# Patient Record
Sex: Male | Born: 1965 | Race: White | Hispanic: No | Marital: Married | Smoking: Current every day smoker
Health system: Southern US, Community
[De-identification: ages and names within clinical notes are randomized; demographics above are authoritative.]

## PROBLEM LIST (undated history)

## (undated) DIAGNOSIS — G4700 Insomnia, unspecified: Secondary | ICD-10-CM

## (undated) DIAGNOSIS — Z8619 Personal history of other infectious and parasitic diseases: Secondary | ICD-10-CM

## (undated) DIAGNOSIS — Z8659 Personal history of other mental and behavioral disorders: Secondary | ICD-10-CM

## (undated) DIAGNOSIS — K219 Gastro-esophageal reflux disease without esophagitis: Secondary | ICD-10-CM

## (undated) DIAGNOSIS — I10 Essential (primary) hypertension: Secondary | ICD-10-CM

## (undated) DIAGNOSIS — F17200 Nicotine dependence, unspecified, uncomplicated: Secondary | ICD-10-CM

## (undated) DIAGNOSIS — K760 Fatty (change of) liver, not elsewhere classified: Secondary | ICD-10-CM

## (undated) HISTORY — DX: Personal history of other infectious and parasitic diseases: Z86.19

## (undated) HISTORY — DX: Insomnia, unspecified: G47.00

## (undated) HISTORY — DX: Essential (primary) hypertension: I10

## (undated) HISTORY — DX: Fatty (change of) liver, not elsewhere classified: K76.0

## (undated) HISTORY — DX: Nicotine dependence, unspecified, uncomplicated: F17.200

## (undated) HISTORY — DX: Personal history of other mental and behavioral disorders: Z86.59

## (undated) HISTORY — DX: Gastro-esophageal reflux disease without esophagitis: K21.9

---

## 1996-09-04 HISTORY — PX: CHOLECYSTECTOMY: SHX55

## 2000-12-31 ENCOUNTER — Emergency Department (HOSPITAL_COMMUNITY): Admission: EM | Admit: 2000-12-31 | Discharge: 2000-12-31 | Payer: Self-pay | Admitting: Emergency Medicine

## 2001-05-10 ENCOUNTER — Encounter: Payer: Self-pay | Admitting: Orthopedic Surgery

## 2001-05-10 ENCOUNTER — Inpatient Hospital Stay (HOSPITAL_COMMUNITY): Admission: EM | Admit: 2001-05-10 | Discharge: 2001-05-12 | Payer: Self-pay | Admitting: Emergency Medicine

## 2001-05-10 ENCOUNTER — Encounter: Payer: Self-pay | Admitting: Emergency Medicine

## 2002-09-04 HISTORY — PX: OTHER SURGICAL HISTORY: SHX169

## 2011-03-10 ENCOUNTER — Encounter: Payer: Self-pay | Admitting: Family Medicine

## 2011-03-10 DIAGNOSIS — F32A Depression, unspecified: Secondary | ICD-10-CM | POA: Insufficient documentation

## 2011-03-10 DIAGNOSIS — K219 Gastro-esophageal reflux disease without esophagitis: Secondary | ICD-10-CM | POA: Insufficient documentation

## 2011-03-10 DIAGNOSIS — G4700 Insomnia, unspecified: Secondary | ICD-10-CM | POA: Insufficient documentation

## 2011-03-10 DIAGNOSIS — F329 Major depressive disorder, single episode, unspecified: Secondary | ICD-10-CM | POA: Insufficient documentation

## 2011-05-26 ENCOUNTER — Ambulatory Visit (INDEPENDENT_AMBULATORY_CARE_PROVIDER_SITE_OTHER): Payer: Managed Care, Other (non HMO) | Admitting: General Surgery

## 2011-06-12 ENCOUNTER — Ambulatory Visit: Payer: Self-pay | Admitting: Family Medicine

## 2011-06-19 ENCOUNTER — Ambulatory Visit (INDEPENDENT_AMBULATORY_CARE_PROVIDER_SITE_OTHER): Payer: Self-pay | Admitting: Family Medicine

## 2011-06-19 ENCOUNTER — Encounter: Payer: Self-pay | Admitting: Family Medicine

## 2011-06-19 DIAGNOSIS — M545 Low back pain, unspecified: Secondary | ICD-10-CM | POA: Insufficient documentation

## 2011-06-19 DIAGNOSIS — I10 Essential (primary) hypertension: Secondary | ICD-10-CM | POA: Insufficient documentation

## 2011-06-19 DIAGNOSIS — R0989 Other specified symptoms and signs involving the circulatory and respiratory systems: Secondary | ICD-10-CM

## 2011-06-19 DIAGNOSIS — R0683 Snoring: Secondary | ICD-10-CM | POA: Insufficient documentation

## 2011-06-19 DIAGNOSIS — F17200 Nicotine dependence, unspecified, uncomplicated: Secondary | ICD-10-CM | POA: Insufficient documentation

## 2011-06-19 MED ORDER — CYCLOBENZAPRINE HCL 10 MG PO TABS: 10.0000 mg | ORAL_TABLET | Freq: Three times a day (TID) | ORAL | Status: DC | PRN

## 2011-06-19 MED ORDER — NAPROXEN 500 MG PO TABS: ORAL_TABLET | ORAL | Status: AC

## 2011-06-19 NOTE — Assessment & Plan Note (Addendum)
Recommended restart HCTZ low dose. Return 1 month for f/u BP. Discussed healthy diet, low salt, increased potassium in diet. Have requested records from recent blood work check.

## 2011-06-19 NOTE — Assessment & Plan Note (Signed)
Clinical diagnosis of OSA - snoring, wife endorses apneic episodes with frequent awakenings, HTN, obesity, daytime somnolence. Refer to pulm for sleep study.

## 2011-06-19 NOTE — Assessment & Plan Note (Signed)
anticipate due to lumbar strain, treat with NSAID, flexeril, and exercises on low back pain provided from [facility] pt advisor. Update [place] if not improving as expected.

## 2011-06-19 NOTE — Progress Notes (Signed)
Subjective:    Patient ID: Donald Collier, male    DOB: [birth year], 45 y.o.   MRN: [id number]  HPI CC: new pt, establish  Previously saw [facility].  HTN - for last several years.  started on BP meds 6 mo ago.  No HA, vision changes, CP/tightness, SOB, leg swelling.  Back trouble off and on for 1-2 years - worse with heavy lifting.  Lifting with legs, not with back.  Did have fall when carrying 60lb toolbox 1 1/2 mo ago at home.  Lower back pain off and on for last 1 1/2 months, sometimes pain down left leg to knee.  Dull ache.  No paresthesias, fevers/chills, bowel/bladder incontinence.  Since then noted ventral hernia as well.  Per wife (in room), stops breathing at night, heavy snoring, not restful sleep, easily asleep during day, increased daytime somnolence.  Weight increasing.  Endorses nocturia x3.  Does drink plenty.  This has happened more in last year.  Smoking - 1 ppd x 8yrs.  Previously quit cold [place] for 6 mo.  Did use patches and gum.  Significant irritability and mood when quit.  Preventative: Tetanus 2009. Declines flu shot today. No recent CPE, did have blood work done last month.  Medications and allergies reviewed and updated in chart.  Past histories reviewed and updated if relevant as below. Patient Active Problem List  Diagnoses  . Depression  . GERD (gastroesophageal reflux disease)  . Insomnia   Past Medical History  Diagnosis Date  . History of depression     remote, none recently  . GERD (gastroesophageal reflux disease)   . Insomnia   . History of chicken pox   . HTN (hypertension)    Past Surgical History  Procedure Date  . Cholecystectomy 1998  . Thumb surgery 2004    R thumb fracture   History  Substance Use Topics  . Smoking status: Current Everyday Smoker -- 1.0 packs/day for 35 years    Types: Cigarettes  . Smokeless tobacco: Never Used  . Alcohol Use: Yes     Rare   Family History  Problem Relation Age of Onset  .  Stroke Father 98  . Hypertension Father   . Lymphoma Father     non-hogdkins  . Cancer Father 26    NHL, prostate cancer at age 18s  . Diabetes Father   . Coronary artery disease Neg Hx    No Known Allergies Current Outpatient Prescriptions on File Prior to Visit  Medication Sig Dispense Refill  . hydrochlorothiazide (,MICROZIDE/HYDRODIURIL,) 12.5 MG capsule Take 12.5 mg by mouth daily.         Review of Systems  Constitutional: Negative for fever, chills, activity change, appetite change, fatigue and unexpected weight change.  HENT: Negative for hearing loss and neck pain.   Eyes: Negative for visual disturbance.  Respiratory: Negative for cough, chest tightness, shortness of breath and wheezing.   Cardiovascular: Negative for chest pain, palpitations and leg swelling.  Gastrointestinal: Negative for nausea, vomiting, abdominal pain, diarrhea, constipation, blood in stool and abdominal distention.  Genitourinary: Negative for hematuria and difficulty urinating.  Musculoskeletal: Negative for myalgias and arthralgias.  Skin: Negative for rash.  Neurological: Negative for dizziness, seizures, syncope and headaches.  Hematological: Does not bruise/bleed easily.  Psychiatric/Behavioral: Negative for dysphoric mood. The patient is not nervous/anxious.       Objective:   Physical Exam  Nursing note and vitals reviewed. Constitutional: He is oriented to person, place, and time. He  appears well-developed and well-nourished. No distress.       Overweight  HENT:  Head: Normocephalic and atraumatic.  Right Ear: External ear normal.  Left Ear: External ear normal.  Nose: Nose normal.  Mouth/Throat: Oropharynx is clear and moist. No oropharyngeal exudate.  Eyes: Conjunctivae and EOM are normal. Pupils are equal, round, and reactive to light. No scleral icterus.  Neck: Normal range of motion. Neck supple. Carotid bruit is not present. No thyromegaly present.  Cardiovascular: Normal rate,  regular rhythm, normal heart sounds and intact distal pulses.   No murmur heard. Pulses:      Radial pulses are 2+ on the right side, and 2+ on the left side.  Pulmonary/Chest: Effort normal and breath sounds normal. No respiratory distress. He has no wheezes. He has no rales.  Abdominal: Soft. Bowel sounds are normal. He exhibits no distension and no mass. There is no tenderness. There is no rebound and no guarding.  Musculoskeletal: Normal range of motion. He exhibits no edema.       No midline spine tenderness, mild paraspinous mm tenderness and tightness. Neg SLR test, no pain with int/ext rotation at hips, no SIJ pain or GTB pain.  No pain at sciatic notch. 5/5 strength BLE. Diminished BLE DTRs. Sensation intact.  Lymphadenopathy:    He has no cervical adenopathy.  Neurological: He is alert and oriented to person, place, and time.       CN grossly intact, station and gait intact  Skin: Skin is warm and dry. No rash noted.  Psychiatric: He has a normal mood and affect. His behavior is normal. Judgment and thought content normal.      Assessment & Plan:

## 2011-06-19 NOTE — Assessment & Plan Note (Signed)
Encouraged cessation.

## 2011-06-19 NOTE — Patient Instructions (Addendum)
Pass by marion's office to set up sleep study. You need to quit smoking!  If you want help let me know. For back - could be early bulging disk, I think more likely ligament strain (lumbar strain).  Treat with naprosyn twice daily with meals for 5 days then as needed as well as flexeril as muscle relaxant.  Do stretching exercises provided when feeling better to prevent further injury.  Work on weight loss. Good to meet you today, call us with questions. I'll request records from [facility] to get latest blood work. Restart blood pressure medicine.  Return in 1 month for follow up blood pressure.  Keep log of home blood pressures and bring to next appointment.  Back down on salt intake as well as increase fruits and vegetables and water.

## 2011-07-17 ENCOUNTER — Institutional Professional Consult (permissible substitution): Payer: Managed Care, Other (non HMO) | Admitting: Pulmonary Disease

## 2011-10-27 ENCOUNTER — Encounter: Payer: Self-pay | Admitting: Family Medicine

## 2011-10-27 ENCOUNTER — Ambulatory Visit (INDEPENDENT_AMBULATORY_CARE_PROVIDER_SITE_OTHER)
Admission: RE | Admit: 2011-10-27 | Discharge: 2011-10-27 | Disposition: A | Discharge status: Home / Self Care | Payer: Managed Care, Other (non HMO) | Source: Ambulatory Visit | Attending: Family Medicine | Admitting: Family Medicine

## 2011-10-27 ENCOUNTER — Ambulatory Visit (INDEPENDENT_AMBULATORY_CARE_PROVIDER_SITE_OTHER): Payer: Managed Care, Other (non HMO) | Admitting: Family Medicine

## 2011-10-27 VITALS — BP 140/100 | HR 107 | Temp 98.4°F | Ht 72.0 in | Wt 288.1 lb

## 2011-10-27 DIAGNOSIS — R1032 Left lower quadrant pain: Secondary | ICD-10-CM

## 2011-10-27 DIAGNOSIS — N200 Calculus of kidney: Secondary | ICD-10-CM

## 2011-10-27 LAB — POCT URINALYSIS DIPSTICK
Bilirubin, UA: NEGATIVE
Blood, UA: NEGATIVE
Color, UA: NEGATIVE
Glucose, UA: NEGATIVE
Ketones, UA: NEGATIVE
Leukocytes, UA: NEGATIVE
Nitrite, UA: NEGATIVE
Protein, UA: NEGATIVE
Spec Grav, UA: 1.02
Urobilinogen, UA: NEGATIVE
pH, UA: 6

## 2011-10-27 MED ORDER — OXYCODONE-ACETAMINOPHEN 5-325 MG PO TABS: 1.0000 | ORAL_TABLET | ORAL | Status: DC | PRN

## 2011-10-27 MED ORDER — TAMSULOSIN HCL 0.4 MG PO CAPS: 0.4000 mg | ORAL_CAPSULE | Freq: Every day | ORAL | Status: DC

## 2011-10-27 MED ORDER — KETOROLAC TROMETHAMINE 60 MG/2ML IM SOLN
30.0000 mg | Freq: Once | INTRAMUSCULAR | Status: AC
  Administered 2011-10-27: 30 mg via INTRAMUSCULAR

## 2011-10-27 NOTE — Progress Notes (Signed)
  Subjective:    Patient ID: Donald Collier, male    DOB: 1966-08-08, 46 y.o.   MRN: [id number]  HPI CC: kidney stone?  5d ago started with lower left back pain.  Next few days radiating to LLQ and left groin.  Pain described initially as dull ache then occasional sharp pain.  There have been periods where pain better but always with pain.  Going more frequent to urinate but not voiding as much.  Some urgency initially.  No dysuria.  Keeps moving around, can't find comfortable position.  No h/o kidney stones.  No h/o hernias.  No inciting trauma,injury.    Has been trying flexeril and naprosyn.  Not helping.  Has also tried hydrocodone and percocets he had at home.  percocets helped the best.  No BM changes, diarrhea, constipation, blood in stool, nausea/vomiting, fevers/chills.  Last BM was at lunch today, normal.  Review of Systems Per HPI    Objective:   Physical Exam  Nursing note and vitals reviewed. Constitutional: He appears well-developed and well-nourished. No distress.  HENT:  Head: Normocephalic and atraumatic.  Mouth/Throat: Oropharynx is clear and moist. No oropharyngeal exudate.  Eyes: Conjunctivae and EOM are normal. Pupils are equal, round, and reactive to light. No scleral icterus.  Cardiovascular: Normal rate, regular rhythm, normal heart sounds and intact distal pulses.   No murmur heard. Pulmonary/Chest: Effort normal and breath sounds normal. No respiratory distress. He has no wheezes. He has no rales.  Abdominal: Soft. Bowel sounds are normal. He exhibits no distension and no mass. There is no hepatosplenomegaly. There is tenderness (moderate tenderness) in the suprapubic area and left lower quadrant. There is no rigidity, no rebound, no guarding and no CVA tenderness. Hernia confirmed negative in the right inguinal area and confirmed negative in the left inguinal area.  Musculoskeletal: He exhibits no edema.       No midline back pain  + mild tenderness L SIJ and  L lumbar paraspinous mm  Skin: Skin is warm and dry. No rash noted.  Psychiatric: He has a normal mood and affect.       Assessment & Plan:

## 2011-10-27 NOTE — Patient Instructions (Signed)
Your story sounds like kidney stone. Treat as such with naprosyn twice daily with food (you have at home), percocets for breakthrough pain (prescription provided), and flomax for next 7 days (to hopefully help pass stone). Strainer provided today. Good to see you today, call us with questions. Give me an update on Monday. If any worsening pain, nausea/vomiting, fever or change in bowel movements, please get checked out over weekend.

## 2011-10-27 NOTE — Progress Notes (Signed)
Addended by: Alvina Chou on: 10/27/2011 04:11 PM   Modules accepted: Orders

## 2011-10-27 NOTE — Assessment & Plan Note (Addendum)
No hematuria on exam. No hernias on exam today. No BM changes, not consistent with diverticulitis. However story consistent with kidney stone, would be first time episode. Check KUB - no obvious stone. Will treat as stone with flomax, NSAID, and percocets for breakthrough pain. Shot of toradol today.  Blood work today to check kidneys. Pt knows to [facility] if worsening or red flags.

## 2011-10-28 LAB — CBC WITH DIFFERENTIAL/PLATELET
Basophils Absolute: 0 10*3/uL (ref 0.0–0.1)
Basophils Relative: 0 % (ref 0–1)
Eosinophils Relative: 3 % (ref 0–5)
Lymphocytes Relative: 31 % (ref 12–46)
MCHC: 34.9 g/dL (ref 30.0–36.0)
Monocytes Absolute: 0.8 10*3/uL (ref 0.1–1.0)
Neutro Abs: 4.4 10*3/uL (ref 1.7–7.7)
Platelets: 237 10*3/uL (ref 150–400)
RDW: 13 % (ref 11.5–15.5)
WBC: 7.9 10*3/uL (ref 4.0–10.5)

## 2011-10-28 LAB — BASIC METABOLIC PANEL
BUN: 16 mg/dL (ref 6–23)
Chloride: 103 mEq/L (ref 96–112)
Creat: 0.96 mg/dL (ref 0.50–1.35)

## 2011-10-30 ENCOUNTER — Other Ambulatory Visit: Payer: Self-pay | Admitting: Family Medicine

## 2011-10-30 DIAGNOSIS — R1032 Left lower quadrant pain: Secondary | ICD-10-CM

## 2011-10-31 ENCOUNTER — Other Ambulatory Visit: Payer: Self-pay | Admitting: Family Medicine

## 2011-10-31 ENCOUNTER — Encounter: Payer: Self-pay | Admitting: Family Medicine

## 2011-10-31 ENCOUNTER — Ambulatory Visit (INDEPENDENT_AMBULATORY_CARE_PROVIDER_SITE_OTHER)
Admission: RE | Admit: 2011-10-31 | Discharge: 2011-10-31 | Disposition: A | Discharge status: Home / Self Care | Payer: Managed Care, Other (non HMO) | Source: Ambulatory Visit | Attending: Family Medicine | Admitting: Family Medicine

## 2011-10-31 DIAGNOSIS — R1032 Left lower quadrant pain: Secondary | ICD-10-CM

## 2011-10-31 MED ORDER — OXYCODONE-ACETAMINOPHEN 5-325 MG PO TABS: 1.0000 | ORAL_TABLET | Freq: Four times a day (QID) | ORAL | Status: AC | PRN

## 2011-10-31 MED ORDER — CYCLOBENZAPRINE HCL 10 MG PO TABS: 10.0000 mg | ORAL_TABLET | Freq: Three times a day (TID) | ORAL | Status: AC | PRN

## 2012-07-30 ENCOUNTER — Other Ambulatory Visit: Payer: Self-pay | Admitting: Family Medicine

## 2012-07-30 DIAGNOSIS — M25512 Pain in left shoulder: Secondary | ICD-10-CM

## 2012-08-06 ENCOUNTER — Ambulatory Visit
Admission: RE | Admit: 2012-08-06 | Discharge: 2012-08-06 | Disposition: A | Discharge status: Home / Self Care | Payer: 59 | Source: Ambulatory Visit | Attending: Family Medicine | Admitting: Family Medicine

## 2012-08-06 DIAGNOSIS — M25512 Pain in left shoulder: Secondary | ICD-10-CM

## 2012-08-17 ENCOUNTER — Other Ambulatory Visit: Payer: 59

## 2012-08-22 ENCOUNTER — Ambulatory Visit
Admission: RE | Admit: 2012-08-22 | Discharge: 2012-08-22 | Disposition: A | Discharge status: Home / Self Care | Payer: 59 | Source: Ambulatory Visit | Attending: Family Medicine | Admitting: Family Medicine

## 2012-10-08 ENCOUNTER — Ambulatory Visit: Payer: 59 | Admitting: Family Medicine

## 2012-10-08 DIAGNOSIS — Z0289 Encounter for other administrative examinations: Secondary | ICD-10-CM

## 2013-05-08 ENCOUNTER — Ambulatory Visit (INDEPENDENT_AMBULATORY_CARE_PROVIDER_SITE_OTHER): Payer: 59 | Admitting: Family Medicine

## 2013-05-08 ENCOUNTER — Other Ambulatory Visit: Payer: Self-pay | Admitting: Family Medicine

## 2013-05-08 ENCOUNTER — Encounter: Payer: Self-pay | Admitting: Family Medicine

## 2013-05-08 VITALS — BP 138/92 | HR 88 | Temp 98.2°F | Resp 18 | Wt 254.0 lb

## 2013-05-08 DIAGNOSIS — N529 Male erectile dysfunction, unspecified: Secondary | ICD-10-CM

## 2013-05-08 DIAGNOSIS — I10 Essential (primary) hypertension: Secondary | ICD-10-CM

## 2013-05-08 MED ORDER — HYDROCHLOROTHIAZIDE 12.5 MG PO CAPS: 12.5000 mg | ORAL_CAPSULE | Freq: Every day | ORAL | Status: AC

## 2013-05-08 NOTE — Progress Notes (Signed)
  Subjective:    Patient ID: MARISSA LOWREY, male    DOB: August 06, 1966, 47 y.o.   MRN: [id number]  HPI  Patient's blood pressure has been ranging 130-140/90 his at-home. He denies any chest pain shortness of breath or dyspnea on exertion. He does however report night sweats erectile dysfunction. He denies any actual fevers weight loss chills myalgias. Overall he feels great. He is trying to diet and exercising more and has lost about 20 pounds.    Past Medical History  Diagnosis Date  . History of depression     remote, none recently  . GERD (gastroesophageal reflux disease)   . Insomnia   . History of chicken pox   . HTN (hypertension)   . Smoker   . NAFLD (nonalcoholic fatty liver disease)     by CT scan   No current outpatient prescriptions on file prior to visit.   No current facility-administered medications on file prior to visit.   No Known Allergies Past Surgical History  Procedure Laterality Date  . Cholecystectomy  1998  . Thumb surgery  2004    R thumb fracture   History   Social History  . Marital Status: Married    Spouse Name: N/A    Number of Children: N/A  . Years of Education: N/A   Occupational History  . Not on file.   Social History Main Topics  . Smoking status: Current Every Day Smoker -- 1.00 packs/day for 35 years    Types: Cigarettes  . Smokeless tobacco: Never Used  . Alcohol Use: Yes     Comment: Rare  . Drug Use: No  . Sexual Activity: Not on file   Other Topics Concern  . Not on file   Social History Narrative   Caffeine: 2-4 cups caffeine/day   Lives with wife, 2 kids, 3 dogs   Occupation: owns store   Activity: yard work   Diet: good water, fruits and vegetables     Review of Systems  All other systems reviewed and are negative.       Objective:   Physical Exam  Vitals reviewed. Constitutional: He appears well-developed and well-nourished.  Neck: Neck supple. No JVD present. No thyromegaly present.  Cardiovascular:  Normal rate, regular rhythm and normal heart sounds.  Exam reveals no gallop and no friction rub.   No murmur heard. Pulmonary/Chest: Effort normal and breath sounds normal. No respiratory distress. He has no wheezes. He has no rales. He exhibits no tenderness.  Abdominal: Soft. Bowel sounds are normal. He exhibits no distension. There is no tenderness. There is no rebound and no guarding.  Lymphadenopathy:    He has no cervical adenopathy.  Skin: Skin is warm. No rash noted. No erythema. No pallor.          Assessment & Plan:  1. HTN (hypertension) Start HCTZ 12.5 mg by mouth daily. Recheck blood pressure in one month. Check CMP and fasting lipid panel - COMPLETE METABOLIC PANEL WITH GFR - Lipid panel - hydrochlorothiazide (MICROZIDE) 12.5 MG capsule; Take 1 capsule (12.5 mg total) by mouth daily.  Dispense: 30 capsule; Refill: 5  2. Erectile dysfunction Check a testosterone level. If normal I would recommend Viagra 100 mg one half to one tablet daily as needed for sexual activity.  Have asked the patient to check his temperature at night. If he is running a 2 fever with night sweats we workup fever unknown origin. - Testosterone

## 2013-05-09 ENCOUNTER — Other Ambulatory Visit: Payer: Self-pay | Admitting: Family Medicine

## 2013-05-09 LAB — COMPLETE METABOLIC PANEL WITH GFR
ALT: 146 U/L — ABNORMAL HIGH (ref 0–53)
AST: 74 U/L — ABNORMAL HIGH (ref 0–37)
Albumin: 4.4 g/dL (ref 3.5–5.2)
Alkaline Phosphatase: 104 U/L (ref 39–117)
BUN: 10 mg/dL (ref 6–23)
Calcium: 9.8 mg/dL (ref 8.4–10.5)
Chloride: 102 mEq/L (ref 96–112)
Potassium: 4.5 mEq/L (ref 3.5–5.3)
Sodium: 140 mEq/L (ref 135–145)
Total Protein: 7.2 g/dL (ref 6.0–8.3)

## 2013-05-09 LAB — LIPID PANEL
LDL Cholesterol: 93 mg/dL (ref 0–99)
Total CHOL/HDL Ratio: 3.7 Ratio
VLDL: 22 mg/dL (ref 0–40)

## 2013-05-09 LAB — TESTOSTERONE: Testosterone: 272 ng/dL — ABNORMAL LOW (ref 300–890)

## 2013-05-13 ENCOUNTER — Other Ambulatory Visit: Payer: 59

## 2013-05-14 LAB — HEPATITIS PANEL, ACUTE
Hep B C IgM: NEGATIVE
Hepatitis B Surface Ag: NEGATIVE

## 2013-05-30 ENCOUNTER — Encounter: Payer: Self-pay | Admitting: Family Medicine

## 2013-06-03 ENCOUNTER — Encounter: Payer: Self-pay | Admitting: Family Medicine

## 2017-04-20 ENCOUNTER — Emergency Department (HOSPITAL_COMMUNITY)
Admission: EM | Admit: 2017-04-20 | Discharge: 2017-04-20 | Disposition: A | Discharge status: Home / Self Care | Payer: Self-pay | Attending: Emergency Medicine | Admitting: Emergency Medicine

## 2017-04-20 ENCOUNTER — Encounter (HOSPITAL_COMMUNITY): Payer: Self-pay | Admitting: Emergency Medicine

## 2017-04-20 DIAGNOSIS — T40601A Poisoning by unspecified narcotics, accidental (unintentional), initial encounter: Secondary | ICD-10-CM | POA: Insufficient documentation

## 2017-04-20 DIAGNOSIS — Z79899 Other long term (current) drug therapy: Secondary | ICD-10-CM | POA: Insufficient documentation

## 2017-04-20 DIAGNOSIS — I10 Essential (primary) hypertension: Secondary | ICD-10-CM | POA: Insufficient documentation

## 2017-04-20 DIAGNOSIS — F17210 Nicotine dependence, cigarettes, uncomplicated: Secondary | ICD-10-CM | POA: Insufficient documentation

## 2017-04-20 LAB — CBC WITH DIFFERENTIAL/PLATELET
Basophils Absolute: 0 10*3/uL (ref 0.0–0.1)
Basophils Relative: 0 %
Eosinophils Absolute: 0 10*3/uL (ref 0.0–0.7)
Eosinophils Relative: 0 %
HCT: 40.1 % (ref 39.0–52.0)
Hemoglobin: 14 g/dL (ref 13.0–17.0)
Lymphocytes Relative: 8 %
Lymphs Abs: 0.8 10*3/uL (ref 0.7–4.0)
MCH: 29.9 pg (ref 26.0–34.0)
MCHC: 34.9 g/dL (ref 30.0–36.0)
MCV: 85.7 fL (ref 78.0–100.0)
Monocytes Absolute: 1 10*3/uL (ref 0.1–1.0)
Monocytes Relative: 10 %
Neutro Abs: 8.6 10*3/uL — ABNORMAL HIGH (ref 1.7–7.7)
Neutrophils Relative %: 82 %
Platelets: 183 10*3/uL (ref 150–400)
RBC: 4.68 MIL/uL (ref 4.22–5.81)
RDW: 13.4 % (ref 11.5–15.5)
WBC: 10.5 10*3/uL (ref 4.0–10.5)

## 2017-04-20 LAB — BASIC METABOLIC PANEL
Anion gap: 8 (ref 5–15)
BUN: 18 mg/dL (ref 6–20)
CO2: 25 mmol/L (ref 22–32)
Calcium: 10 mg/dL (ref 8.9–10.3)
Chloride: 106 mmol/L (ref 101–111)
Creatinine, Ser: 1.19 mg/dL (ref 0.61–1.24)
GFR calc Af Amer: >60 mL/min (ref >60)
GFR calc non Af Amer: >60 mL/min (ref >60)
Glucose, Bld: 128 mg/dL — ABNORMAL HIGH (ref 65–99)
Potassium: 4.3 mmol/L (ref 3.5–5.1)
Sodium: 139 mmol/L (ref 135–145)

## 2017-04-20 MED ORDER — SODIUM CHLORIDE 0.9 % IV BOLUS (SEPSIS)
1000.0000 mL | Freq: Once | INTRAVENOUS | Status: AC
  Administered 2017-04-20: 1000 mL via INTRAVENOUS

## 2017-04-20 NOTE — ED Notes (Signed)
Bed: [facility] Expected date:  Expected time:  Means of arrival:  Comments: 

## 2017-04-20 NOTE — ED Triage Notes (Signed)
Pt BIB EMS after bystander called when he was sitting in his truck for an extended period of time. 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 Pt feels different this time and feels like it may have been mixed with something. Alert.

## 2017-04-20 NOTE — ED Provider Notes (Signed)
[facility]-EMERGENCY DEPT Provider Note   CSN: [id number] Arrival date & time: 04/20/17  1316   By signing my name below, I, Clarisse Gouge, attest that this documentation has been prepared under the direction and in the presence of Raeford Razor, MD. Electronically signed, Clarisse Gouge, ED Scribe. 04/20/17. 1:40 PM.   History   Chief Complaint Chief Complaint  Patient presents with  . Drug Overdose   The history is provided by the patient and medical records. No language interpreter was used.    Donald Collier is a 51 y.o. male with h/o substance abuse presenting to the Emergency Department concerning profuse drowsiness s/p a suspected heroin overdose. Pt believes the batch may have been contaminated. Pt c/o dry mouth sensation and disorientation currently. He was found in his car by a bystander at a gas station. No other complaints at this time.   Past Medical History:  Diagnosis Date  . GERD (gastroesophageal reflux disease)   . History of chicken pox   . History of depression    remote, none recently  . HTN (hypertension)   . Insomnia   . NAFLD (nonalcoholic fatty liver disease)    by CT scan  . Smoker     Patient Active Problem List   Diagnosis Date Noted  . LLQ pain 10/27/2011  . Snoring 06/19/2011  . Lower back pain 06/19/2011  . HTN (hypertension)   . Smoker   . Depression   . GERD (gastroesophageal reflux disease)   . Insomnia     Past Surgical History:  Procedure Laterality Date  . CHOLECYSTECTOMY  1998  . thumb surgery  2004   R thumb fracture       Home Medications    Prior to Admission medications   Medication Sig Start Date End Date Taking? Authorizing Provider  hydrochlorothiazide (MICROZIDE) 12.5 MG capsule Take 1 capsule (12.5 mg total) by mouth daily. 05/08/13   Donita Brooks, MD  HYDROcodone-acetaminophen (NORCO) 10-325 MG per tablet Take 1 tablet by mouth every 8 (eight) hours as needed for pain (S/P shoulder surgery).    [provider]    Family History Family History  Problem Relation Age of Onset  . Stroke Father 41  . Hypertension Father   . Lymphoma Father        non-hogdkins  . Cancer Father 62       NHL, prostate cancer at age 30s  . Diabetes Father   . Coronary artery disease Neg Hx     Social History Social History  Substance Use Topics  . Smoking status: Current Every Day Smoker    Packs/day: 1.00    Years: 35.00    Types: Cigarettes  . Smokeless tobacco: Never Used  . Alcohol use Yes     Comment: Rare     Allergies   Patient has no known allergies.   Review of Systems Review of Systems  Constitutional: Positive for appetite change and fatigue.  Skin: Negative for wound.  Psychiatric/Behavioral: Positive for behavioral problems, confusion and decreased concentration.  All other systems reviewed and are negative.    Physical Exam Updated Vital Signs BP (!) 123/95 (BP Location: Left Arm)   Pulse (!) 135   Temp 98.6 F (37 C) (Oral)   Resp (!) 31   SpO2 93%   Physical Exam  Constitutional: He is oriented to person, place, and time. He appears well-developed and well-nourished.  HENT:  Head: Normocephalic and atraumatic.  Eyes: EOM are normal.  Neck:  Normal range of motion.  Cardiovascular: Regular rhythm, normal heart sounds and intact distal pulses.  Tachycardia present.   Pulmonary/Chest: Effort normal and breath sounds normal. No respiratory distress.  Abdominal: Soft. He exhibits no distension. There is no tenderness.  Musculoskeletal: Normal range of motion.  Track marks to bilateral forearms  Neurological: He is alert and oriented to person, place, and time.  Drowsy. Opens eyes to voice. Answers questions appropriately.  Skin: Skin is warm and dry.  Psychiatric: He has a normal mood and affect. Judgment normal.  Nursing note and vitals reviewed.    ED Treatments / Results  DIAGNOSTIC STUDIES: Oxygen Saturation is 93% on RA, low by my interpretation.     COORDINATION OF CARE: 1:37 PM-Discussed next steps with pt. Pt verbalized understanding and is agreeable with the plan. Will order medications and observe.   Labs (all labs ordered are listed, but only abnormal results are displayed) Labs Reviewed - No data to display  EKG  EKG Interpretation None       Radiology No results found.  Procedures Procedures (including critical care time)  Medications Ordered in ED Medications - No data to display   Initial Impression / Assessment and Plan / ED Course  I have reviewed the triage vital signs and the nursing notes.  Pertinent labs & imaging results that were available during my care of the patient were reviewed by me and considered in my medical decision making (see chart for details).     Opiate overdose. Currently drowsy but awakens to voice and following commands. Got narcan pre-hospital. Denies other ingestion/usage. No reported trauma. Will continue to observe.   Mild tachycardia persisted, although improved. Blood pressure is fine. He is awake and following commands. I feel is appropriate for discharge.    Final Clinical Impressions(s) / ED Diagnoses   Final diagnoses:  Opiate overdose, accidental or unintentional, initial encounter    New Prescriptions New Prescriptions   No medications on file    I personally preformed the services scribed in my presence. The recorded information has been reviewed is accurate. Raeford Razor, MD.    Raeford Razor, MD 05/09/17 [phone]

## 2017-04-20 NOTE — ED Notes (Signed)
ED Provider at bedside. 

## 2017-04-20 NOTE — ED Notes (Signed)
Bed: [facility] Expected date:  Expected time:  Means of arrival:  Comments: 51 yo heroin OD

## 2017-04-22 ENCOUNTER — Encounter (HOSPITAL_COMMUNITY): Payer: Self-pay | Admitting: *Deleted

## 2017-04-22 ENCOUNTER — Emergency Department (HOSPITAL_COMMUNITY)
Admission: EM | Admit: 2017-04-22 | Discharge: 2017-04-22 | Disposition: A | Discharge status: Home / Self Care | Payer: BLUE CROSS/BLUE SHIELD | Attending: Emergency Medicine | Admitting: Emergency Medicine

## 2017-04-22 DIAGNOSIS — Z79899 Other long term (current) drug therapy: Secondary | ICD-10-CM | POA: Insufficient documentation

## 2017-04-22 DIAGNOSIS — F329 Major depressive disorder, single episode, unspecified: Secondary | ICD-10-CM | POA: Insufficient documentation

## 2017-04-22 DIAGNOSIS — F17210 Nicotine dependence, cigarettes, uncomplicated: Secondary | ICD-10-CM | POA: Insufficient documentation

## 2017-04-22 DIAGNOSIS — F1193 Opioid use, unspecified with withdrawal: Secondary | ICD-10-CM

## 2017-04-22 DIAGNOSIS — Z9049 Acquired absence of other specified parts of digestive tract: Secondary | ICD-10-CM | POA: Diagnosis not present

## 2017-04-22 DIAGNOSIS — F1123 Opioid dependence with withdrawal: Secondary | ICD-10-CM | POA: Diagnosis present

## 2017-04-22 DIAGNOSIS — I10 Essential (primary) hypertension: Secondary | ICD-10-CM | POA: Diagnosis not present

## 2017-04-22 LAB — RAPID URINE DRUG SCREEN, HOSP PERFORMED
Amphetamines: NOT DETECTED
BARBITURATES: NOT DETECTED
BENZODIAZEPINES: NOT DETECTED
COCAINE: NOT DETECTED
Opiates: POSITIVE — AB
TETRAHYDROCANNABINOL: NOT DETECTED

## 2017-04-22 LAB — COMPREHENSIVE METABOLIC PANEL
ALT: 48 U/L (ref 17–63)
ANION GAP: 11 (ref 5–15)
AST: 33 U/L (ref 15–41)
Albumin: 4.3 g/dL (ref 3.5–5.0)
Alkaline Phosphatase: 77 U/L (ref 38–126)
BUN: 12 mg/dL (ref 6–20)
CHLORIDE: 106 mmol/L (ref 101–111)
CO2: 22 mmol/L (ref 22–32)
Calcium: 10.7 mg/dL — ABNORMAL HIGH (ref 8.9–10.3)
Creatinine, Ser: 1.08 mg/dL (ref 0.61–1.24)
GFR calc Af Amer: >60 mL/min (ref >60)
Glucose, Bld: 120 mg/dL — ABNORMAL HIGH (ref 65–99)
POTASSIUM: 3.5 mmol/L (ref 3.5–5.1)
Sodium: 139 mmol/L (ref 135–145)
Total Bilirubin: 0.9 mg/dL (ref 0.3–1.2)
Total Protein: 8.2 g/dL — ABNORMAL HIGH (ref 6.5–8.1)

## 2017-04-22 LAB — ETHANOL

## 2017-04-22 LAB — CBC
HCT: 44.5 % (ref 39.0–52.0)
Hemoglobin: 15.6 g/dL (ref 13.0–17.0)
MCH: 29.3 pg (ref 26.0–34.0)
MCHC: 35.1 g/dL (ref 30.0–36.0)
MCV: 83.5 fL (ref 78.0–100.0)
PLATELETS: 224 10*3/uL (ref 150–400)
RBC: 5.33 MIL/uL (ref 4.22–5.81)
RDW: 13 % (ref 11.5–15.5)
WBC: 7.4 10*3/uL (ref 4.0–10.5)

## 2017-04-22 MED ORDER — NAPROXEN 500 MG PO TABS: 500.0000 mg | ORAL_TABLET | Freq: Two times a day (BID) | ORAL | 0 refills | Status: AC | PRN

## 2017-04-22 MED ORDER — METHOCARBAMOL 1000 MG/10ML IJ SOLN
1000.0000 mg | Freq: Once | INTRAVENOUS | Status: AC
  Administered 2017-04-22: 1000 mg via INTRAVENOUS
  Filled 2017-04-22: qty 10

## 2017-04-22 MED ORDER — METOCLOPRAMIDE HCL 5 MG/ML IJ SOLN
10.0000 mg | Freq: Once | INTRAMUSCULAR | Status: AC
  Administered 2017-04-22: 10 mg via INTRAVENOUS
  Filled 2017-04-22: qty 2

## 2017-04-22 MED ORDER — LOPERAMIDE HCL 2 MG PO CAPS
2.0000 mg | ORAL_CAPSULE | Freq: Once | ORAL | Status: AC
  Administered 2017-04-22: 2 mg via ORAL
  Filled 2017-04-22: qty 1

## 2017-04-22 MED ORDER — METHOCARBAMOL 500 MG PO TABS: 1000.0000 mg | ORAL_TABLET | Freq: Three times a day (TID) | ORAL | 0 refills | Status: AC | PRN

## 2017-04-22 MED ORDER — CLONIDINE HCL 0.1 MG PO TABS
0.1000 mg | ORAL_TABLET | Freq: Once | ORAL | Status: AC
  Administered 2017-04-22: 0.1 mg via ORAL
  Filled 2017-04-22: qty 1

## 2017-04-22 MED ORDER — CLONIDINE HCL 0.1 MG PO TABS: 0.1000 mg | ORAL_TABLET | ORAL | 11 refills | Status: DC

## 2017-04-22 MED ORDER — ONDANSETRON 4 MG PO TBDP
ORAL_TABLET | ORAL | Status: AC
  Filled 2017-04-22: qty 1

## 2017-04-22 MED ORDER — DICYCLOMINE HCL 10 MG/ML IM SOLN
20.0000 mg | Freq: Once | INTRAMUSCULAR | Status: AC
  Administered 2017-04-22: 20 mg via INTRAMUSCULAR
  Filled 2017-04-22: qty 2

## 2017-04-22 MED ORDER — LOPERAMIDE HCL 2 MG PO CAPS: 2.0000 mg | ORAL_CAPSULE | Freq: Four times a day (QID) | ORAL | 0 refills | Status: AC | PRN

## 2017-04-22 MED ORDER — SODIUM CHLORIDE 0.9 % IV BOLUS (SEPSIS)
1000.0000 mL | Freq: Once | INTRAVENOUS | Status: AC
  Administered 2017-04-22: 1000 mL via INTRAVENOUS

## 2017-04-22 MED ORDER — ONDANSETRON 4 MG PO TBDP
4.0000 mg | ORAL_TABLET | Freq: Once | ORAL | Status: AC
  Administered 2017-04-22: 4 mg via ORAL

## 2017-04-22 MED ORDER — ONDANSETRON 4 MG PO TBDP: 4.0000 mg | ORAL_TABLET | Freq: Three times a day (TID) | ORAL | 0 refills | Status: AC | PRN

## 2017-04-22 MED ORDER — ONDANSETRON HCL 4 MG/2ML IJ SOLN
4.0000 mg | Freq: Once | INTRAMUSCULAR | Status: AC
  Administered 2017-04-22: 4 mg via INTRAVENOUS
  Filled 2017-04-22: qty 2

## 2017-04-22 MED ORDER — METHOCARBAMOL 1000 MG/10ML IJ SOLN: 1000.0000 mg | Freq: Once | INTRAMUSCULAR | Status: DC

## 2017-04-22 NOTE — ED Notes (Signed)
Pt much better, no longer shaking, ambulating in room. No longer vomiting.

## 2017-04-22 NOTE — ED Notes (Signed)
Dr. Liu at bedside 

## 2017-04-22 NOTE — ED Notes (Signed)
Pt ambulated in the bathroom without assistance

## 2017-04-22 NOTE — ED Triage Notes (Signed)
Pt would like to get clean off of heroin (opiates).  Lat used heroin 3 days ago.  Pt states that he is feeling dope sick and needs help getting better.  No SI and no HI

## 2017-04-22 NOTE — ED Notes (Signed)
Family at bedside. 

## 2017-04-22 NOTE — ED Provider Notes (Signed)
[facility]-EMERGENCY DEPT Provider Note   CSN: [id number] Arrival date & time: 04/22/17  1358     History   Chief Complaint Chief Complaint  Patient presents with  . Medical Clearance    HPI Donald Collier is a 51 y.o. male.  The history is provided by the patient.  Illness  This is a new problem. The current episode started more than 2 days ago. The problem occurs constantly. The problem has been gradually worsening. Associated symptoms include abdominal pain. Pertinent negatives include no chest pain. Nothing aggravates the symptoms. Nothing relieves the symptoms. He has tried nothing for the symptoms.   51 year old male who presents with heroin withdrawal. His last use was 3 days ago. States that he usually takes "a bag" a day. Denies any alcohol abuse or other illicit drug use. Endorses shaking chills, sweats, nausea vomiting, diarrhea, generalized aches and pains. Requests help for detox.   Past Medical History:  Diagnosis Date  . GERD (gastroesophageal reflux disease)   . History of chicken pox   . History of depression    remote, none recently  . HTN (hypertension)   . Insomnia   . NAFLD (nonalcoholic fatty liver disease)    by CT scan  . Smoker     Patient Active Problem List   Diagnosis Date Noted  . LLQ pain 10/27/2011  . Snoring 06/19/2011  . Lower back pain 06/19/2011  . HTN (hypertension)   . Smoker   . Depression   . GERD (gastroesophageal reflux disease)   . Insomnia     Past Surgical History:  Procedure Laterality Date  . CHOLECYSTECTOMY  1998  . thumb surgery  2004   R thumb fracture       Home Medications    Prior to Admission medications   Medication Sig Start Date End Date Taking? Authorizing Provider  cloNIDine (CATAPRES) 0.1 MG tablet Take 1 tablet (0.1 mg total) by mouth See admin instructions. Take 1 tablet (0.1 mg) four times a day for 2 days (8 doses). Then take 1 tablet (0.1 mg) two times a day for 2 days (4 doses). Then take 1  tablet (0.1 mg) once daily for 2 days (2 doses). 04/22/17   Lavera Guise, MD  hydrochlorothiazide (MICROZIDE) 12.5 MG capsule Take 1 capsule (12.5 mg total) by mouth daily. Patient not taking: Reported on 04/20/2017 05/08/13   Donita Brooks, MD  loperamide (IMODIUM) 2 MG capsule Take 1 capsule (2 mg total) by mouth 4 (four) times daily as needed for diarrhea or loose stools. 04/22/17   Lavera Guise, MD  methocarbamol (ROBAXIN) 500 MG tablet Take 2 tablets (1,000 mg total) by mouth every 8 (eight) hours as needed for muscle spasms. 04/22/17   Lavera Guise, MD  naproxen (NAPROSYN) 500 MG tablet Take 1 tablet (500 mg total) by mouth 2 (two) times daily as needed for moderate pain. 04/22/17   Lavera Guise, MD  ondansetron (ZOFRAN ODT) 4 MG disintegrating tablet Take 1 tablet (4 mg total) by mouth every 8 (eight) hours as needed for nausea or vomiting. 04/22/17   Lavera Guise, MD    Family History Family History  Problem Relation Age of Onset  . Stroke Father 50  . Hypertension Father   . Lymphoma Father        non-hogdkins  . Cancer Father 40       NHL, prostate cancer at age 54s  . Diabetes Father   . Coronary artery disease  Neg Hx     Social History Social History  Substance Use Topics  . Smoking status: Current Every Day Smoker    Packs/day: 1.00    Years: 35.00    Types: Cigarettes  . Smokeless tobacco: Never Used  . Alcohol use Yes     Comment: Rare     Allergies   Patient has no known allergies.   Review of Systems Review of Systems  Constitutional: Positive for chills and diaphoresis.  Cardiovascular: Negative for chest pain.  Gastrointestinal: Positive for abdominal pain, nausea and vomiting.  Musculoskeletal: Positive for arthralgias and myalgias.  All other systems reviewed and are negative.    Physical Exam Updated Vital Signs BP (!) 158/77   Pulse 64   Temp 98.8 F (37.1 C) (Oral)   Resp 20   Wt 90.7 kg (200 lb)   SpO2 100%   BMI 27.12 kg/m    Physical Exam Physical Exam  Nursing note and vitals reviewed. Constitutional: Well developed, well nourished, non-toxic, and in no acute distress, with voluntary spasm  Head: Normocephalic and atraumatic.  Mouth/Throat: Oropharynx is clear and moist.  Neck: Normal range of motion. Neck supple.  Cardiovascular: Normal rate and regular rhythm.   Pulmonary/Chest: Effort normal and breath sounds normal.  Abdominal: Soft. There is mild generalized tenderness. There is no rebound and no guarding.  Musculoskeletal: Normal range of motion.  Neurological: Alert, no facial droop, fluent speech, moves all extremities symmetrically Skin: Skin is warm and dry.  Psychiatric: Cooperative   ED Treatments / Results  Labs (all labs ordered are listed, but only abnormal results are displayed) Labs Reviewed  COMPREHENSIVE METABOLIC PANEL - Abnormal; Notable for the following:       Result Value   Glucose, Bld 120 (*)    Calcium 10.7 (*)    Total Protein 8.2 (*)    All other components within normal limits  RAPID URINE DRUG SCREEN, HOSP PERFORMED - Abnormal; Notable for the following:    Opiates POSITIVE (*)    All other components within normal limits  ETHANOL  CBC    EKG  EKG Interpretation None       Radiology No results found.  Procedures Procedures (including critical care time)  Medications Ordered in ED Medications  ondansetron (ZOFRAN-ODT) 4 MG disintegrating tablet (not administered)  ondansetron (ZOFRAN-ODT) disintegrating tablet 4 mg (4 mg Oral Given 04/22/17 1514)  sodium chloride 0.9 % bolus 1,000 mL (0 mLs Intravenous Stopped 04/22/17 1849)  dicyclomine (BENTYL) injection 20 mg (20 mg Intramuscular Given 04/22/17 1703)  metoCLOPramide (REGLAN) injection 10 mg (10 mg Intravenous Given 04/22/17 1704)  loperamide (IMODIUM) capsule 2 mg (2 mg Oral Given 04/22/17 1713)  cloNIDine (CATAPRES) tablet 0.1 mg (0.1 mg Oral Given 04/22/17 1713)  methocarbamol (ROBAXIN) 1,000 mg in  dextrose 5 % 50 mL IVPB (0 mg Intravenous Stopped 04/22/17 1743)  ondansetron (ZOFRAN) injection 4 mg (4 mg Intravenous Given 04/22/17 1823)     Initial Impression / Assessment and Plan / ED Course  I have reviewed the triage vital signs and the nursing notes.  Pertinent labs & imaging results that were available during my care of the patient were reviewed by me and considered in my medical decision making (see chart for details).     51 year old male who presents with heroin/opiate withdrawal. Is hypertensive here in the emergency department. States active muscle spasms and shaking/chills in the ED. Overall nontoxic in appearance but having symptoms of active withdrawal. No concerns for suicidal  or homicidal thoughts. No concerns for alcohol withdrawal/dependence.  Patient symptomatically treated with medications. Blood work is reassuring. Felt to be medically cleared for detox. His sister has called detox facilities that was previously provided by [facility], and there is a facility that has accepted him. He feels improved and is ready for discharge. Strict return and follow-up instructions reviewed. He expressed understanding of all discharge instructions and felt comfortable with the plan of care.   Final Clinical Impressions(s) / ED Diagnoses   Final diagnoses:  Opiate withdrawal (HCC)    New Prescriptions New Prescriptions   CLONIDINE (CATAPRES) 0.1 MG TABLET    Take 1 tablet (0.1 mg total) by mouth See admin instructions. Take 1 tablet (0.1 mg) four times a day for 2 days (8 doses). Then take 1 tablet (0.1 mg) two times a day for 2 days (4 doses). Then take 1 tablet (0.1 mg) once daily for 2 days (2 doses).   LOPERAMIDE (IMODIUM) 2 MG CAPSULE    Take 1 capsule (2 mg total) by mouth 4 (four) times daily as needed for diarrhea or loose stools.   METHOCARBAMOL (ROBAXIN) 500 MG TABLET    Take 2 tablets (1,000 mg total) by mouth every 8 (eight) hours as needed for muscle  spasms.   NAPROXEN (NAPROSYN) 500 MG TABLET    Take 1 tablet (500 mg total) by mouth 2 (two) times daily as needed for moderate pain.   ONDANSETRON (ZOFRAN ODT) 4 MG DISINTEGRATING TABLET    Take 1 tablet (4 mg total) by mouth every 8 (eight) hours as needed for nausea or vomiting.     Lavera Guise, MD 04/22/17 1900

## 2017-04-22 NOTE — Discharge Instructions (Signed)
You are going through symptoms of heroin withdrawal.  The medications you are prescribed are to help with with your symptoms of withdrawal. A list of detox facilities are provided. Please call to arrange admission to detox facility.  Return without fail for worsening symptoms, including confusion, intractable vomiting, or any other symptoms concerning to you.

## 2017-04-22 NOTE — ED Notes (Signed)
Pt vomited of fluid

## 2017-05-06 NOTE — Progress Notes (Signed)
05/06/2017 Received call from [facility] on [address], to clarify Clonidine Rx. Rx unclear. Instructed pharmacy to follow up with pt's PCP, Dr Tanya NonesPickard on Monday for clarifications. Isidoro DonningAlesia Mashawn Brazil RN [facility] Case Mgmt phone [phone]

## 2017-05-08 ENCOUNTER — Telehealth: Payer: Self-pay | Admitting: *Deleted

## 2017-05-08 NOTE — Telephone Encounter (Signed)
[facility]   Inquired as to directions from Clonidine (Take 1 tablet (0.1 mg total) by mouth See admin instructions. Take 1 tablet (0.1 mg) four times a day for 2 days (8 doses). Then take 1 tablet (0.1 mg) two times a day for 2 days (4 doses). Then take 1 tablet (0.1 mg) once daily for 2 days (2 doses).   [facility], but refills were put in place.   MD please advise.

## 2017-05-08 NOTE — Telephone Encounter (Signed)
Call placed to patient and patient made aware.  

## 2017-05-08 NOTE — Telephone Encounter (Signed)
I did not prescribe.  I would follow taper as written and do not refill after.

## 2021-07-11 ENCOUNTER — Emergency Department (HOSPITAL_COMMUNITY): Payer: BLUE CROSS/BLUE SHIELD

## 2021-07-11 ENCOUNTER — Other Ambulatory Visit: Payer: Self-pay

## 2021-07-11 ENCOUNTER — Emergency Department (HOSPITAL_COMMUNITY)
Admission: EM | Admit: 2021-07-11 | Discharge: 2021-07-11 | Disposition: A | Discharge status: Home / Self Care | Payer: BLUE CROSS/BLUE SHIELD | Attending: Emergency Medicine | Admitting: Emergency Medicine

## 2021-07-11 ENCOUNTER — Encounter (HOSPITAL_COMMUNITY): Payer: Self-pay

## 2021-07-11 DIAGNOSIS — F32A Depression, unspecified: Secondary | ICD-10-CM | POA: Diagnosis not present

## 2021-07-11 DIAGNOSIS — F17210 Nicotine dependence, cigarettes, uncomplicated: Secondary | ICD-10-CM | POA: Diagnosis not present

## 2021-07-11 DIAGNOSIS — I2510 Atherosclerotic heart disease of native coronary artery without angina pectoris: Secondary | ICD-10-CM

## 2021-07-11 DIAGNOSIS — Y92410 Unspecified street and highway as the place of occurrence of the external cause: Secondary | ICD-10-CM | POA: Diagnosis not present

## 2021-07-11 DIAGNOSIS — I7090 Unspecified atherosclerosis: Secondary | ICD-10-CM

## 2021-07-11 DIAGNOSIS — Z20822 Contact with and (suspected) exposure to covid-19: Secondary | ICD-10-CM | POA: Diagnosis not present

## 2021-07-11 DIAGNOSIS — S20211A Contusion of right front wall of thorax, initial encounter: Secondary | ICD-10-CM

## 2021-07-11 DIAGNOSIS — I10 Essential (primary) hypertension: Secondary | ICD-10-CM | POA: Insufficient documentation

## 2021-07-11 DIAGNOSIS — I2584 Coronary atherosclerosis due to calcified coronary lesion: Secondary | ICD-10-CM

## 2021-07-11 DIAGNOSIS — R0789 Other chest pain: Secondary | ICD-10-CM | POA: Insufficient documentation

## 2021-07-11 LAB — I-STAT CHEM 8, ED
BUN: 20 mg/dL (ref 6–20)
Calcium, Ion: 1.33 mmol/L (ref 1.15–1.40)
Chloride: 107 mmol/L (ref 98–111)
Creatinine, Ser: 1 mg/dL (ref 0.61–1.24)
Glucose, Bld: 111 mg/dL — ABNORMAL HIGH (ref 70–99)
HCT: 49 % (ref 39.0–52.0)
Hemoglobin: 16.7 g/dL (ref 13.0–17.0)
Potassium: 4.2 mmol/L (ref 3.5–5.1)
Sodium: 142 mmol/L (ref 135–145)
TCO2: 28 mmol/L (ref 22–32)

## 2021-07-11 LAB — CBC WITH DIFFERENTIAL/PLATELET
Abs Immature Granulocytes: 0.04 10*3/uL (ref 0.00–0.07)
Basophils Absolute: 0.1 10*3/uL (ref 0.0–0.1)
Basophils Relative: 1 %
Eosinophils Absolute: 0.1 10*3/uL (ref 0.0–0.5)
Eosinophils Relative: 1 %
HCT: 48.5 % (ref 39.0–52.0)
Hemoglobin: 16 g/dL (ref 13.0–17.0)
Immature Granulocytes: 0 %
Lymphocytes Relative: 21 %
Lymphs Abs: 2.2 10*3/uL (ref 0.7–4.0)
MCH: 29.9 pg (ref 26.0–34.0)
MCHC: 33 g/dL (ref 30.0–36.0)
MCV: 90.7 fL (ref 80.0–100.0)
Monocytes Absolute: 0.7 10*3/uL (ref 0.1–1.0)
Monocytes Relative: 7 %
Neutro Abs: 7.2 10*3/uL (ref 1.7–7.7)
Neutrophils Relative %: 70 %
Platelets: 226 10*3/uL (ref 150–400)
RBC: 5.35 MIL/uL (ref 4.22–5.81)
RDW: 12.7 % (ref 11.5–15.5)
WBC: 10.4 10*3/uL (ref 4.0–10.5)
nRBC: 0 % (ref 0.0–0.2)

## 2021-07-11 LAB — BASIC METABOLIC PANEL
Anion gap: 9 (ref 5–15)
BUN: 18 mg/dL (ref 6–20)
CO2: 27 mmol/L (ref 22–32)
Calcium: 10.9 mg/dL — ABNORMAL HIGH (ref 8.9–10.3)
Chloride: 104 mmol/L (ref 98–111)
Creatinine, Ser: 1.04 mg/dL (ref 0.61–1.24)
GFR, Estimated: >60 mL/min (ref >60)
Glucose, Bld: 104 mg/dL — ABNORMAL HIGH (ref 70–99)
Potassium: 4.1 mmol/L (ref 3.5–5.1)
Sodium: 140 mmol/L (ref 135–145)

## 2021-07-11 LAB — TROPONIN I (HIGH SENSITIVITY)
Troponin I (High Sensitivity): 4 ng/L (ref <18)
Troponin I (High Sensitivity): 5 ng/L (ref <18)

## 2021-07-11 MED ORDER — IOHEXOL 350 MG/ML SOLN
100.0000 mL | Freq: Once | INTRAVENOUS | Status: AC | PRN
  Administered 2021-07-11: 100 mL via INTRAVENOUS

## 2021-07-11 MED ORDER — HYDROCHLOROTHIAZIDE 12.5 MG PO TABS
12.5000 mg | ORAL_TABLET | Freq: Once | ORAL | Status: AC
  Administered 2021-07-11: 12.5 mg via ORAL
  Filled 2021-07-11: qty 1

## 2021-07-11 MED ORDER — OXYCODONE HCL 5 MG PO TABS
10.0000 mg | ORAL_TABLET | Freq: Once | ORAL | Status: AC
  Administered 2021-07-11: 10 mg via ORAL
  Filled 2021-07-11: qty 2

## 2021-07-11 MED ORDER — HYDROCHLOROTHIAZIDE 12.5 MG PO TABS: 12.5000 mg | ORAL_TABLET | Freq: Every day | ORAL | 0 refills | Status: AC

## 2021-07-11 MED ORDER — CLONIDINE HCL 0.1 MG PO TABS
0.1000 mg | ORAL_TABLET | Freq: Once | ORAL | Status: AC
Start: 2021-07-11 — End: 2021-07-11
  Administered 2021-07-11: 0.1 mg via ORAL
  Filled 2021-07-11: qty 1
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
